# Patient Record
Sex: Male | Born: 1975 | Hispanic: Yes | Marital: Married | Smoking: Never smoker
Health system: Southern US, Community
[De-identification: ages and names within clinical notes are randomized; demographics above are authoritative.]

---

## 2017-06-24 DIAGNOSIS — Z0000 Encounter for general adult medical examination without abnormal findings: Secondary | ICD-10-CM | POA: Diagnosis not present

## 2017-06-24 DIAGNOSIS — Z125 Encounter for screening for malignant neoplasm of prostate: Secondary | ICD-10-CM | POA: Diagnosis not present

## 2017-07-01 DIAGNOSIS — E781 Pure hyperglyceridemia: Secondary | ICD-10-CM | POA: Diagnosis not present

## 2017-07-01 DIAGNOSIS — H6122 Impacted cerumen, left ear: Secondary | ICD-10-CM | POA: Diagnosis not present

## 2017-07-01 DIAGNOSIS — Z1389 Encounter for screening for other disorder: Secondary | ICD-10-CM | POA: Diagnosis not present

## 2017-07-01 DIAGNOSIS — Z0000 Encounter for general adult medical examination without abnormal findings: Secondary | ICD-10-CM | POA: Diagnosis not present

## 2017-09-18 DIAGNOSIS — R140 Abdominal distension (gaseous): Secondary | ICD-10-CM | POA: Diagnosis not present

## 2017-09-18 DIAGNOSIS — R6881 Early satiety: Secondary | ICD-10-CM | POA: Diagnosis not present

## 2017-09-18 DIAGNOSIS — R109 Unspecified abdominal pain: Secondary | ICD-10-CM | POA: Diagnosis not present

## 2018-06-25 DIAGNOSIS — Z0000 Encounter for general adult medical examination without abnormal findings: Secondary | ICD-10-CM | POA: Diagnosis not present

## 2018-07-02 DIAGNOSIS — Z0000 Encounter for general adult medical examination without abnormal findings: Secondary | ICD-10-CM | POA: Diagnosis not present

## 2018-07-02 DIAGNOSIS — E781 Pure hyperglyceridemia: Secondary | ICD-10-CM | POA: Diagnosis not present

## 2018-07-02 DIAGNOSIS — Z6826 Body mass index (BMI) 26.0-26.9, adult: Secondary | ICD-10-CM | POA: Diagnosis not present

## 2018-07-02 DIAGNOSIS — Z1389 Encounter for screening for other disorder: Secondary | ICD-10-CM | POA: Diagnosis not present

## 2019-06-29 DIAGNOSIS — Z125 Encounter for screening for malignant neoplasm of prostate: Secondary | ICD-10-CM | POA: Diagnosis not present

## 2019-06-29 DIAGNOSIS — Z0000 Encounter for general adult medical examination without abnormal findings: Secondary | ICD-10-CM | POA: Diagnosis not present

## 2019-06-29 DIAGNOSIS — E781 Pure hyperglyceridemia: Secondary | ICD-10-CM | POA: Diagnosis not present

## 2019-07-05 DIAGNOSIS — E781 Pure hyperglyceridemia: Secondary | ICD-10-CM | POA: Diagnosis not present

## 2019-07-05 DIAGNOSIS — Z1331 Encounter for screening for depression: Secondary | ICD-10-CM | POA: Diagnosis not present

## 2019-07-05 DIAGNOSIS — R0789 Other chest pain: Secondary | ICD-10-CM | POA: Diagnosis not present

## 2019-07-05 DIAGNOSIS — Z0000 Encounter for general adult medical examination without abnormal findings: Secondary | ICD-10-CM | POA: Diagnosis not present

## 2020-02-16 DIAGNOSIS — K219 Gastro-esophageal reflux disease without esophagitis: Secondary | ICD-10-CM | POA: Diagnosis not present

## 2020-02-16 DIAGNOSIS — R209 Unspecified disturbances of skin sensation: Secondary | ICD-10-CM | POA: Diagnosis not present

## 2020-02-16 DIAGNOSIS — Z1331 Encounter for screening for depression: Secondary | ICD-10-CM | POA: Diagnosis not present

## 2020-05-12 DIAGNOSIS — Z20822 Contact with and (suspected) exposure to covid-19: Secondary | ICD-10-CM | POA: Diagnosis not present

## 2020-05-12 DIAGNOSIS — Z20828 Contact with and (suspected) exposure to other viral communicable diseases: Secondary | ICD-10-CM | POA: Diagnosis not present

## 2020-06-08 DIAGNOSIS — D485 Neoplasm of uncertain behavior of skin: Secondary | ICD-10-CM | POA: Diagnosis not present

## 2020-06-08 DIAGNOSIS — D225 Melanocytic nevi of trunk: Secondary | ICD-10-CM | POA: Diagnosis not present

## 2020-06-08 DIAGNOSIS — D2261 Melanocytic nevi of right upper limb, including shoulder: Secondary | ICD-10-CM | POA: Diagnosis not present

## 2020-06-08 DIAGNOSIS — L738 Other specified follicular disorders: Secondary | ICD-10-CM | POA: Diagnosis not present

## 2020-06-08 DIAGNOSIS — D2272 Melanocytic nevi of left lower limb, including hip: Secondary | ICD-10-CM | POA: Diagnosis not present

## 2020-06-08 DIAGNOSIS — D2262 Melanocytic nevi of left upper limb, including shoulder: Secondary | ICD-10-CM | POA: Diagnosis not present

## 2020-09-13 DIAGNOSIS — Z0000 Encounter for general adult medical examination without abnormal findings: Secondary | ICD-10-CM | POA: Diagnosis not present

## 2020-09-13 DIAGNOSIS — Z125 Encounter for screening for malignant neoplasm of prostate: Secondary | ICD-10-CM | POA: Diagnosis not present

## 2020-09-13 DIAGNOSIS — E781 Pure hyperglyceridemia: Secondary | ICD-10-CM | POA: Diagnosis not present

## 2020-09-20 DIAGNOSIS — E781 Pure hyperglyceridemia: Secondary | ICD-10-CM | POA: Diagnosis not present

## 2020-09-20 DIAGNOSIS — Z0000 Encounter for general adult medical examination without abnormal findings: Secondary | ICD-10-CM | POA: Diagnosis not present

## 2020-11-16 DIAGNOSIS — Z20822 Contact with and (suspected) exposure to covid-19: Secondary | ICD-10-CM | POA: Diagnosis not present

## 2020-11-30 DIAGNOSIS — Z20822 Contact with and (suspected) exposure to covid-19: Secondary | ICD-10-CM | POA: Diagnosis not present

## 2020-11-30 DIAGNOSIS — Z1212 Encounter for screening for malignant neoplasm of rectum: Secondary | ICD-10-CM | POA: Diagnosis not present

## 2020-12-23 DIAGNOSIS — Z20822 Contact with and (suspected) exposure to covid-19: Secondary | ICD-10-CM | POA: Diagnosis not present

## 2020-12-31 DIAGNOSIS — Z20822 Contact with and (suspected) exposure to covid-19: Secondary | ICD-10-CM | POA: Diagnosis not present

## 2021-04-05 DIAGNOSIS — Z20822 Contact with and (suspected) exposure to covid-19: Secondary | ICD-10-CM | POA: Diagnosis not present

## 2021-04-05 DIAGNOSIS — Z03818 Encounter for observation for suspected exposure to other biological agents ruled out: Secondary | ICD-10-CM | POA: Diagnosis not present

## 2021-06-19 DIAGNOSIS — L814 Other melanin hyperpigmentation: Secondary | ICD-10-CM | POA: Diagnosis not present

## 2021-06-19 DIAGNOSIS — L738 Other specified follicular disorders: Secondary | ICD-10-CM | POA: Diagnosis not present

## 2021-06-19 DIAGNOSIS — D225 Melanocytic nevi of trunk: Secondary | ICD-10-CM | POA: Diagnosis not present

## 2021-09-21 DIAGNOSIS — E781 Pure hyperglyceridemia: Secondary | ICD-10-CM | POA: Diagnosis not present

## 2021-09-21 DIAGNOSIS — Z125 Encounter for screening for malignant neoplasm of prostate: Secondary | ICD-10-CM | POA: Diagnosis not present

## 2021-10-02 ENCOUNTER — Other Ambulatory Visit: Payer: Self-pay | Admitting: Internal Medicine

## 2021-10-02 DIAGNOSIS — Z0000 Encounter for general adult medical examination without abnormal findings: Secondary | ICD-10-CM | POA: Diagnosis not present

## 2021-10-02 DIAGNOSIS — E781 Pure hyperglyceridemia: Secondary | ICD-10-CM | POA: Diagnosis not present

## 2021-10-02 DIAGNOSIS — Z1212 Encounter for screening for malignant neoplasm of rectum: Secondary | ICD-10-CM | POA: Diagnosis not present

## 2021-10-02 DIAGNOSIS — Z1331 Encounter for screening for depression: Secondary | ICD-10-CM | POA: Diagnosis not present

## 2021-10-02 DIAGNOSIS — Z1339 Encounter for screening examination for other mental health and behavioral disorders: Secondary | ICD-10-CM | POA: Diagnosis not present

## 2021-10-02 DIAGNOSIS — R82998 Other abnormal findings in urine: Secondary | ICD-10-CM | POA: Diagnosis not present

## 2021-10-29 ENCOUNTER — Ambulatory Visit
Admission: RE | Admit: 2021-10-29 | Discharge: 2021-10-29 | Disposition: A | Discharge status: Home / Self Care | Payer: No Typology Code available for payment source | Source: Ambulatory Visit | Attending: Internal Medicine | Admitting: Internal Medicine

## 2021-10-29 DIAGNOSIS — E781 Pure hyperglyceridemia: Secondary | ICD-10-CM

## 2021-12-22 ENCOUNTER — Ambulatory Visit (HOSPITAL_COMMUNITY)
Admission: EM | Admit: 2021-12-22 | Discharge: 2021-12-22 | Disposition: A | Discharge status: Home / Self Care | Payer: BC Managed Care – PPO | Attending: Physician Assistant | Admitting: Physician Assistant

## 2021-12-22 ENCOUNTER — Encounter (HOSPITAL_COMMUNITY): Payer: Self-pay | Admitting: *Deleted

## 2021-12-22 ENCOUNTER — Other Ambulatory Visit: Payer: Self-pay

## 2021-12-22 DIAGNOSIS — J209 Acute bronchitis, unspecified: Secondary | ICD-10-CM | POA: Diagnosis not present

## 2021-12-22 MED ORDER — PREDNISONE 20 MG PO TABS: 40.0000 mg | ORAL_TABLET | Freq: Every day | ORAL | 0 refills | Status: AC

## 2021-12-22 NOTE — ED Triage Notes (Signed)
Pt reports he has had a cough for 20 days . He also has muscle pain from coughing.

## 2021-12-22 NOTE — ED Provider Notes (Signed)
[place]    CSN: [id number] Arrival date & time: 12/22/21  1522      History   Chief Complaint Chief Complaint  Patient presents with   Cough   Muscle Pain    HPI Juan Hawkins is a 46 y.o. male.   Patient here today for evaluation of cough that he has had the last 3 weeks.  He states that symptoms started when he traveled to [place].  He states that there was 1 morning he woke up with significant sore throat, he took medication for same and this resolved but he states he has had a cough since that time.  Cough was originally productive but has not been productive for the last couple weeks.  He denies any shortness of breath.  He does have some pain in his chest with deep breathing and cough. Cough does seem to be worse at night.  He has not had any fever.  He denies any other symptoms.  The history is provided by the patient.   History reviewed. No pertinent past medical history.  There are no problems to display for this patient.   History reviewed. No pertinent surgical history.     Home Medications    Prior to Admission medications   Medication Sig Start Date End Date Taking? Authorizing Provider  predniSONE (DELTASONE) 20 MG tablet Take 2 tablets (40 mg total) by mouth daily with breakfast for 5 days. 12/22/21 12/27/21 Yes Francene Finders, PA-C    Family History History reviewed. No pertinent family history.  Social History Social History   Tobacco Use   Smoking status: Never   Smokeless tobacco: Never     Allergies   Patient has no known allergies.   Review of Systems Review of Systems  Constitutional:  Negative for chills and fever.  HENT:  Negative for congestion, ear pain and sore throat.   Eyes:  Negative for discharge and redness.  Respiratory:  Positive for cough. Negative for shortness of breath.   Gastrointestinal:  Negative for abdominal pain, nausea and vomiting.    Physical Exam Triage Vital Signs ED Triage Vitals   Enc Vitals Group     BP      Pulse      Resp      Temp      Temp src      SpO2      Weight      Height      Head Circumference      Peak Flow      Pain Score      Pain Loc      Pain Edu?      Excl. in [place]?    No data found.  Updated Vital Signs BP 113/70    Pulse 68    Temp 98.3 F (36.8 C)    Resp 18    SpO2 100%      Physical Exam Vitals and nursing note reviewed.  Constitutional:      General: He is not in acute distress.    Appearance: Normal appearance. He is not ill-appearing.  HENT:     Head: Normocephalic and atraumatic.     Nose: Nose normal. No congestion.  Eyes:     Conjunctiva/sclera: Conjunctivae normal.  Cardiovascular:     Rate and Rhythm: Normal rate and regular rhythm.     Heart sounds: Normal heart sounds. No murmur heard. Pulmonary:     Effort: Pulmonary effort is normal. No respiratory distress.  Breath sounds: Normal breath sounds. No wheezing, rhonchi or rales.  Skin:    General: Skin is warm and dry.  Neurological:     Mental Status: He is alert.  Psychiatric:        Mood and Affect: Mood normal.        Thought Content: Thought content normal.     UC Treatments / Results  Labs (all labs ordered are listed, but only abnormal results are displayed) Labs Reviewed - No data to display  EKG   Radiology No results found.  Procedures Procedures (including critical care time)  Medications Ordered in UC Medications - No data to display  Initial Impression / Assessment and Plan / UC Course  I have reviewed the triage vital signs and the nursing notes.  Pertinent labs & imaging results that were available during my care of the patient were reviewed by me and considered in my medical decision making (see chart for details).    Suspect likely bronchitis and will treat with steroid burst.  Recommended follow-up if symptoms fail to improve or worsen.  Final Clinical Impressions(s) / UC Diagnoses   Final diagnoses:  Acute  bronchitis, unspecified organism   Discharge Instructions   None    ED Prescriptions     Medication Sig Dispense Auth. Provider   predniSONE (DELTASONE) 20 MG tablet Take 2 tablets (40 mg total) by mouth daily with breakfast for 5 days. 10 tablet Francene Finders, PA-C      PDMP not reviewed this encounter.   Francene Finders, PA-C 12/22/21 1658

## 2021-12-31 DIAGNOSIS — R071 Chest pain on breathing: Secondary | ICD-10-CM | POA: Diagnosis not present

## 2021-12-31 DIAGNOSIS — R0981 Nasal congestion: Secondary | ICD-10-CM | POA: Diagnosis not present

## 2021-12-31 DIAGNOSIS — R051 Acute cough: Secondary | ICD-10-CM | POA: Diagnosis not present

## 2022-01-04 ENCOUNTER — Other Ambulatory Visit: Payer: Self-pay | Admitting: Internal Medicine

## 2022-01-04 ENCOUNTER — Ambulatory Visit
Admission: RE | Admit: 2022-01-04 | Discharge: 2022-01-04 | Disposition: A | Discharge status: Home / Self Care | Payer: BC Managed Care – PPO | Source: Ambulatory Visit | Attending: Internal Medicine | Admitting: Internal Medicine

## 2022-01-04 DIAGNOSIS — R0602 Shortness of breath: Secondary | ICD-10-CM

## 2022-01-04 DIAGNOSIS — R071 Chest pain on breathing: Secondary | ICD-10-CM

## 2022-01-04 DIAGNOSIS — R918 Other nonspecific abnormal finding of lung field: Secondary | ICD-10-CM | POA: Diagnosis not present

## 2022-01-04 DIAGNOSIS — J219 Acute bronchiolitis, unspecified: Secondary | ICD-10-CM | POA: Diagnosis not present

## 2022-01-04 MED ORDER — IOPAMIDOL (ISOVUE-370) INJECTION 76%
80.0000 mL | Freq: Once | INTRAVENOUS | Status: AC | PRN
  Administered 2022-01-04: 80 mL via INTRAVENOUS

## 2022-04-09 DIAGNOSIS — R0602 Shortness of breath: Secondary | ICD-10-CM | POA: Diagnosis not present

## 2022-04-09 DIAGNOSIS — R42 Dizziness and giddiness: Secondary | ICD-10-CM | POA: Diagnosis not present

## 2022-04-09 DIAGNOSIS — R0689 Other abnormalities of breathing: Secondary | ICD-10-CM | POA: Diagnosis not present

## 2022-04-09 DIAGNOSIS — E781 Pure hyperglyceridemia: Secondary | ICD-10-CM | POA: Diagnosis not present

## 2022-06-17 DIAGNOSIS — L300 Nummular dermatitis: Secondary | ICD-10-CM | POA: Diagnosis not present

## 2022-06-17 DIAGNOSIS — D2271 Melanocytic nevi of right lower limb, including hip: Secondary | ICD-10-CM | POA: Diagnosis not present

## 2022-06-17 DIAGNOSIS — D2272 Melanocytic nevi of left lower limb, including hip: Secondary | ICD-10-CM | POA: Diagnosis not present

## 2022-08-23 IMAGING — CT CT CARDIAC CORONARY ARTERY CALCIUM SCORE
2 of 3 series · 10 of 20 positions shown, 12 images · non-contrast
Comparison: None.

CLINICAL DATA: 45-year-old Hispanic male with history
hyperlipidemia and family history of heart disease.

EXAM:
CT CARDIAC CORONARY ARTERY CALCIUM SCORE
TECHNIQUE: Non-contrast imaging through the heart was performed using
prospective ECG gating. Image post processing was performed on an
independent workstation, allowing for quantitative analysis of the
heart and coronary arteries. Note that this exam targets the heart
and the chest was not imaged in its entirety.

[Series 3: calcium scoring 2.00 br40 bestdiast 71% axial · axial · 0.52mm/px · z∈[+1500,+1604]mm · 5 of 80 slices shown, 7 images]
[im 14/80  vessel]
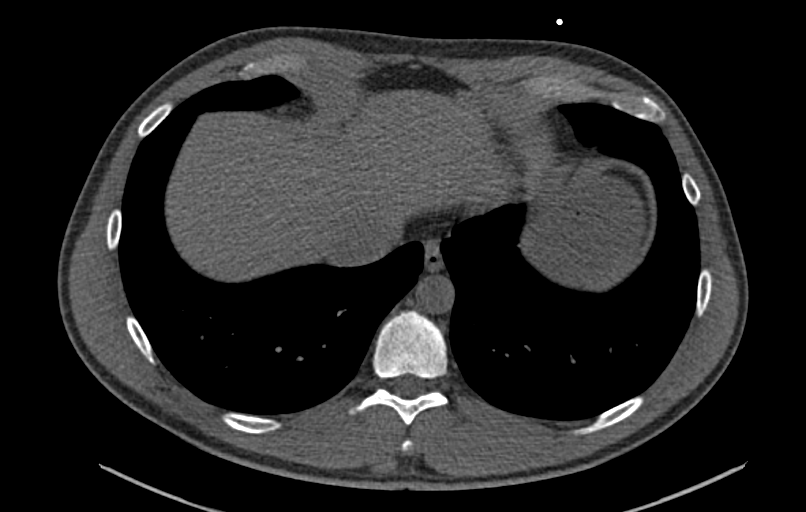
[im 14/80  lung]
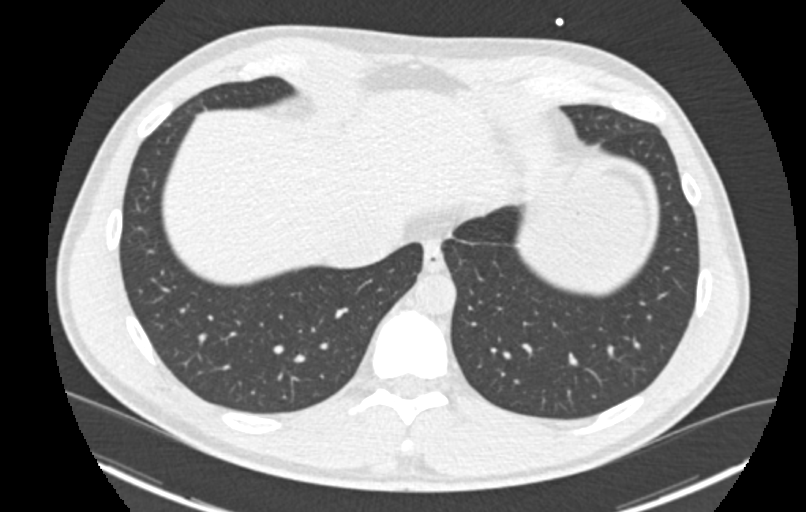
[im 27/80  vessel]
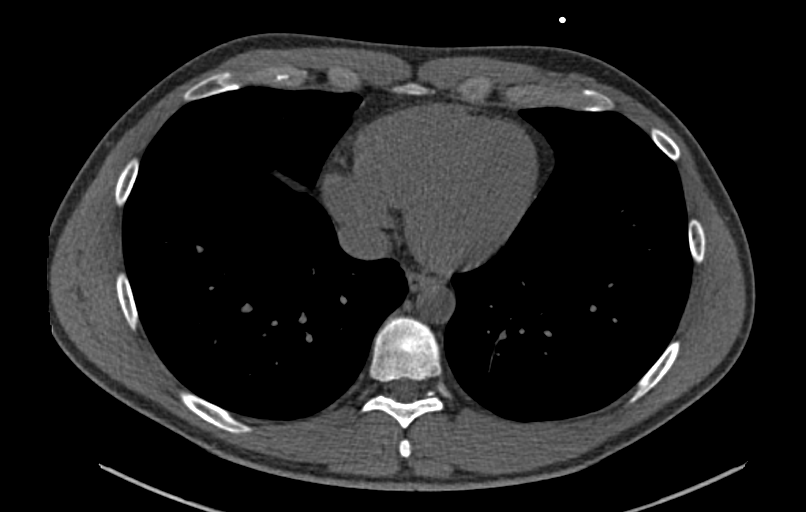
[im 40/80  vessel]
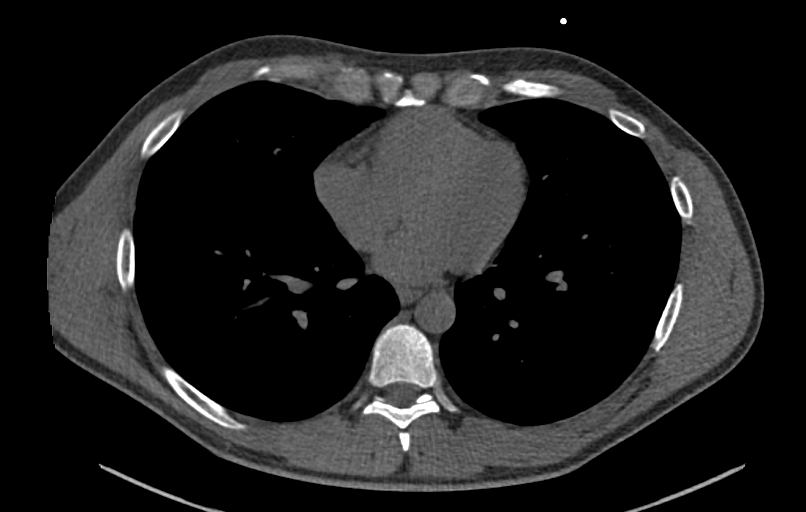
[im 53/80  vessel]
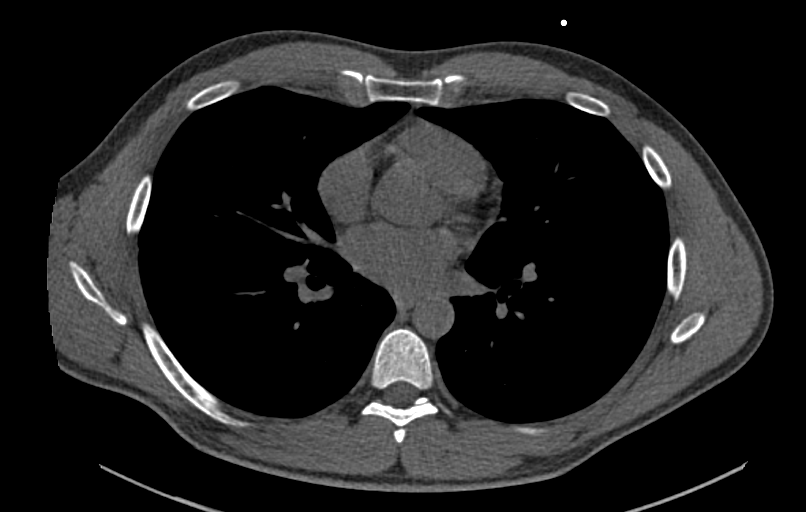
[im 66/80  vessel]
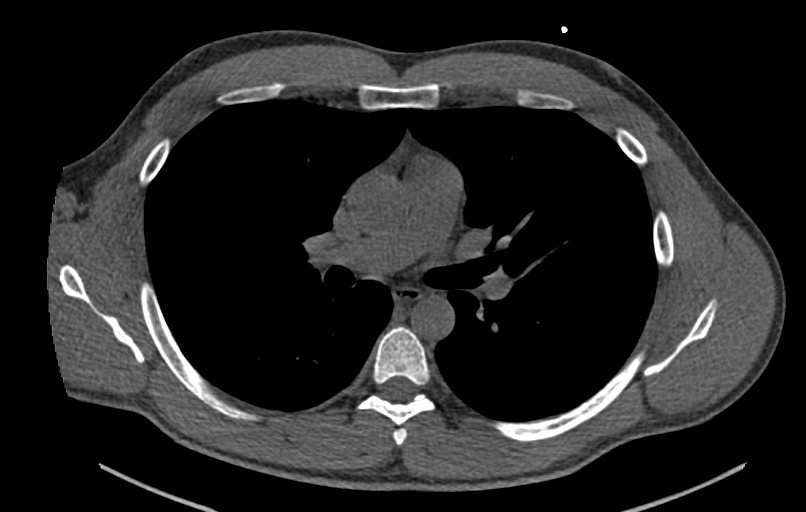
[im 66/80  lung]
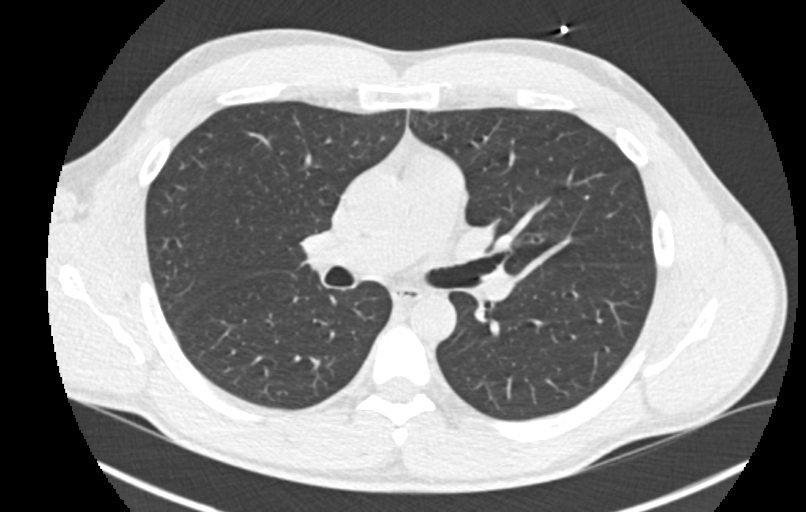

[Series 9: calcium scoring 2.00 br60 bestdiast 71% lungs · axial · 0.50mm/px · z∈[+1502,+1606]mm · 5 of 79 slices shown]
[im 14/79  vessel]
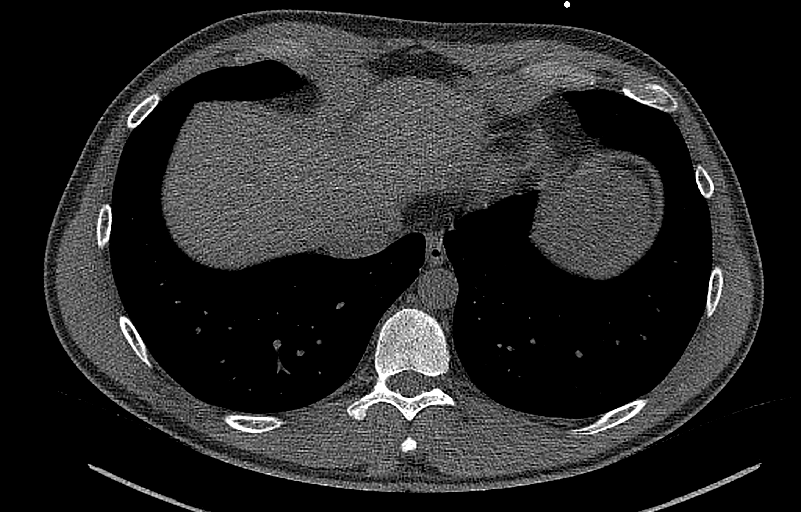
[im 27/79  vessel]
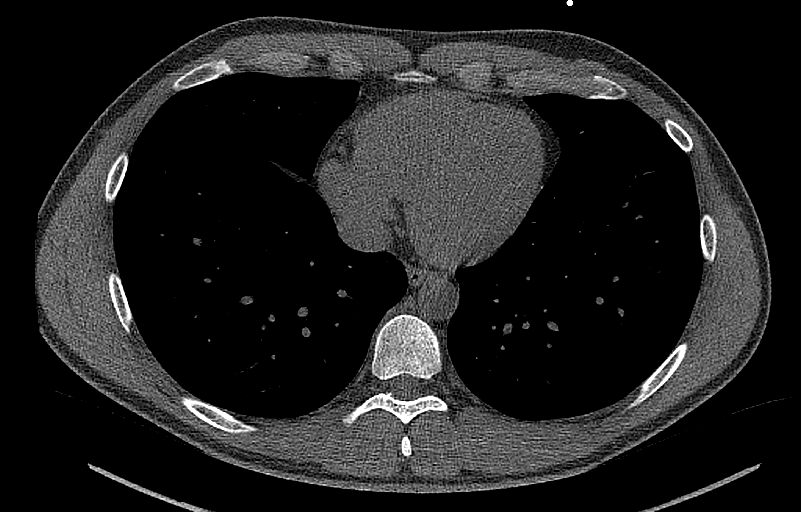
[im 40/79  vessel]
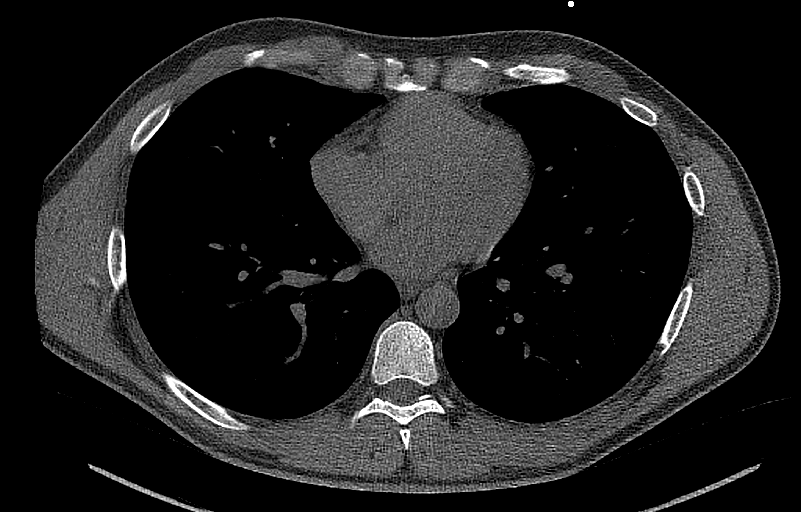
[im 53/79  vessel]
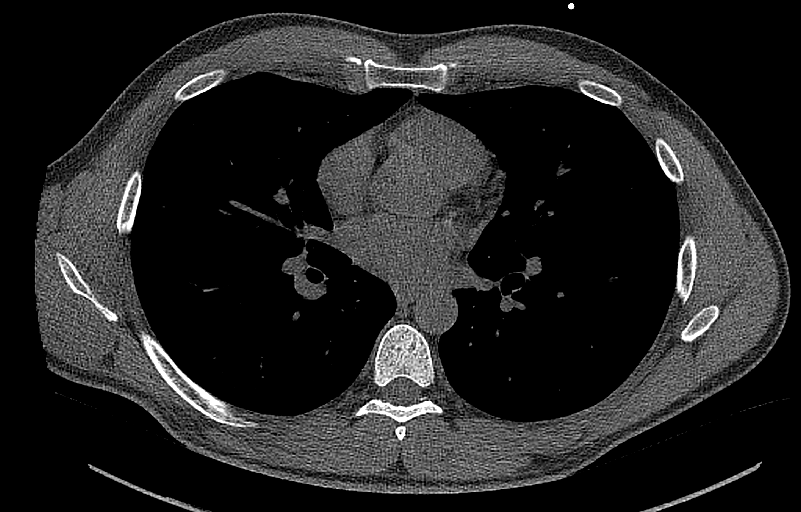
[im 66/79  vessel]
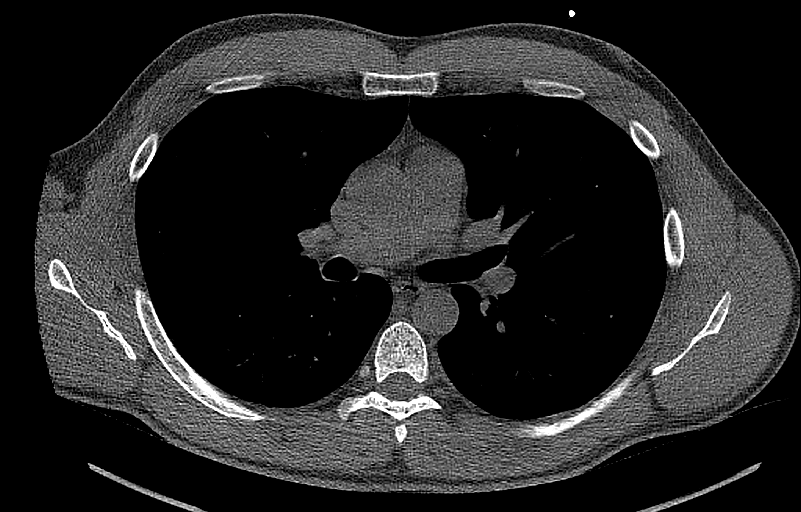

[10 of 20 positions shown; findings below may reference images not displayed]

FINDINGS: CORONARY CALCIUM SCORES:

Left Main: 0

LAD: 0

LCx: 0

RCA: 0

Total Agatston Score: 0

[HOSPITAL] percentile: 0

AORTA MEASUREMENTS:

Ascending Aorta: 30 mm

Descending Aorta: 22 mm

OTHER FINDINGS:

The heart size is within normal limits. No pericardial fluid is
identified. Visualized segments of the thoracic aorta and central
pulmonary arteries are normal in caliber. Visualized mediastinum and
hilar regions demonstrate no lymphadenopathy or masses. Small
calcified granuloma in the subpleural right middle lobe. Visualized
lungs show no evidence of pulmonary edema, consolidation,
pneumothorax or pleural fluid. Visualized upper abdomen and bony
structures are unremarkable.
IMPRESSION: Coronary calcium score of 0.

## 2022-10-09 DIAGNOSIS — Z125 Encounter for screening for malignant neoplasm of prostate: Secondary | ICD-10-CM | POA: Diagnosis not present

## 2022-10-09 DIAGNOSIS — E781 Pure hyperglyceridemia: Secondary | ICD-10-CM | POA: Diagnosis not present

## 2022-10-15 DIAGNOSIS — Z1331 Encounter for screening for depression: Secondary | ICD-10-CM | POA: Diagnosis not present

## 2022-10-15 DIAGNOSIS — Z1339 Encounter for screening examination for other mental health and behavioral disorders: Secondary | ICD-10-CM | POA: Diagnosis not present

## 2022-10-15 DIAGNOSIS — E781 Pure hyperglyceridemia: Secondary | ICD-10-CM | POA: Diagnosis not present

## 2022-10-15 DIAGNOSIS — Z0000 Encounter for general adult medical examination without abnormal findings: Secondary | ICD-10-CM | POA: Diagnosis not present

## 2022-10-15 DIAGNOSIS — R82998 Other abnormal findings in urine: Secondary | ICD-10-CM | POA: Diagnosis not present

## 2022-10-29 IMAGING — CT CT ANGIO CHEST
3 of 8 series · 17 of 36 positions shown · IV contrast (agent unspecified)
Comparison: None.

CLINICAL DATA: Shortness of breath, right-sided inspiratory chest
pain, cough for 1 month

EXAM:
CT ANGIOGRAPHY CHEST WITH CONTRAST
TECHNIQUE: Multidetector CT imaging of the chest was performed using the
standard protocol during bolus administration of intravenous
contrast. Multiplanar CT image reconstructions and MIPs were
obtained to evaluate the vascular anatomy.

[Series 11: cta pulmonary 2.00 bv36 s3 · coronal · 0.66mm/px · 1 of 145 slices shown]
[im 73/145  mediastinal]
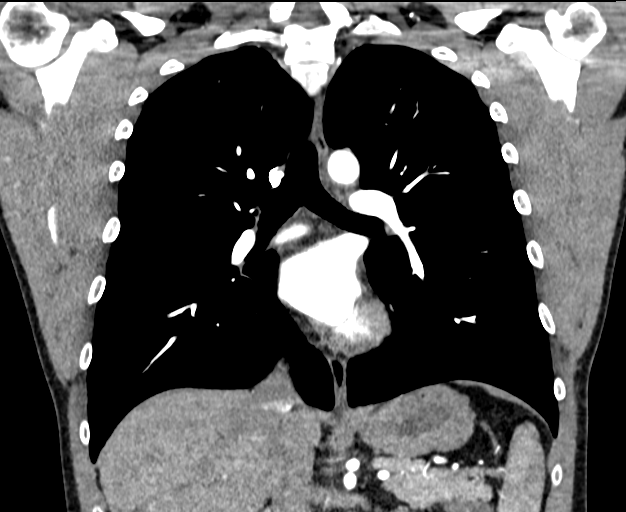

[Series 15: cta pulmonary 1.00 bv36 s3 thins · axial · 0.79mm/px · z∈[+1502,+1778]mm · 7 of 369 slices shown]
[im 47/369  lung]
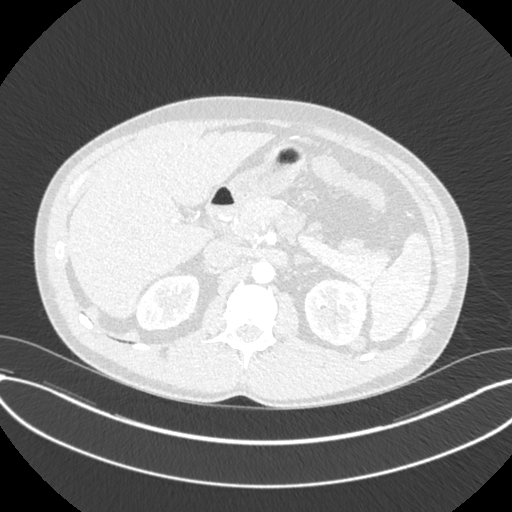
[im 93/369  lung]
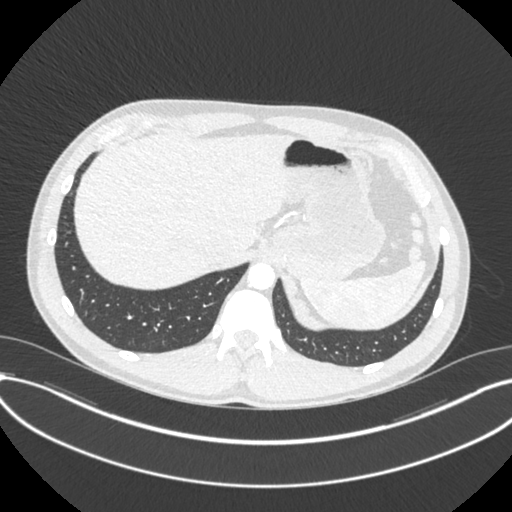
[im 139/369  lung]
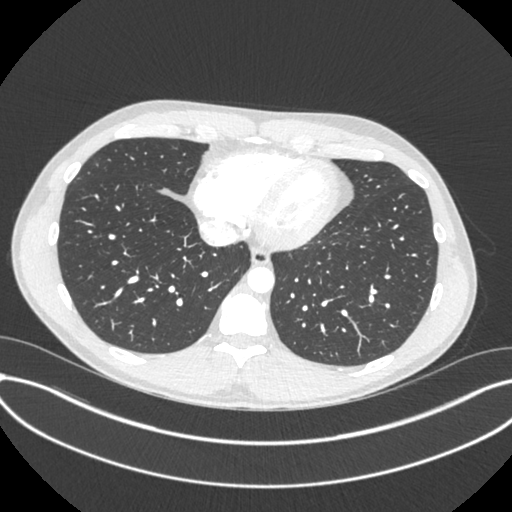
[im 185/369  lung]
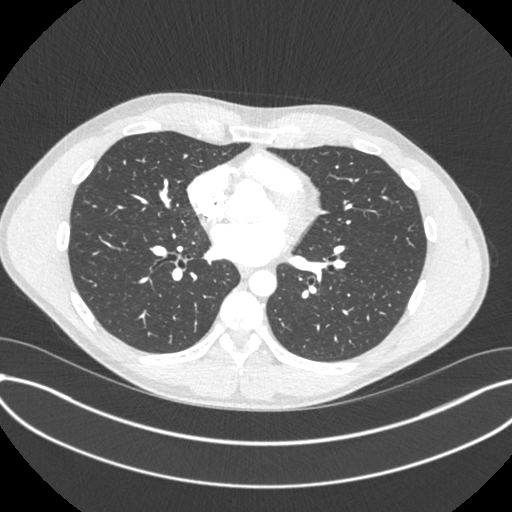
[im 231/369  lung]
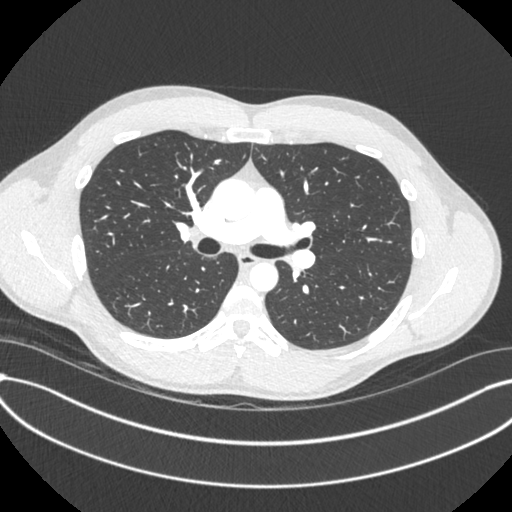
[im 277/369  lung]
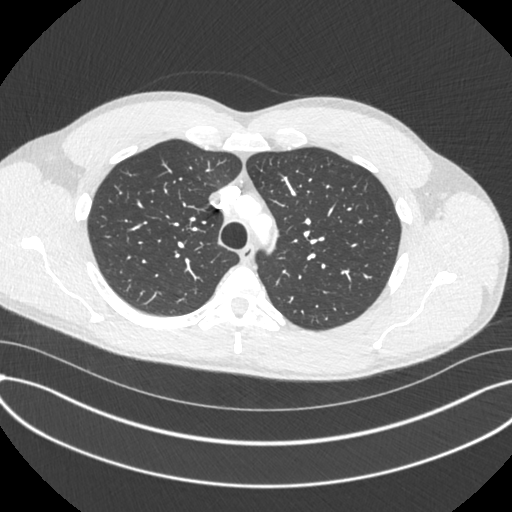
[im 323/369  lung]
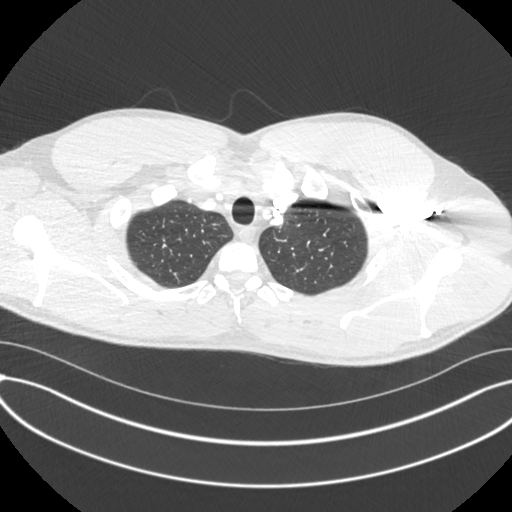

[Series 16: cta pulmonary 1.00 bv36 s3 super d. · axial · 0.83mm/px · z∈[+1497,+1790]mm · 9 of 459 slices shown]
[im 46/459  lung]
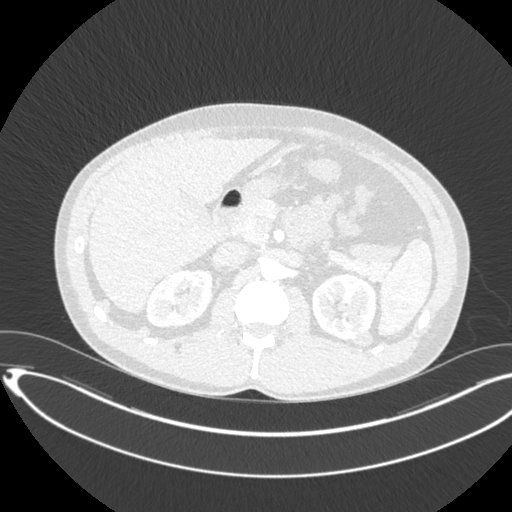
[im 92/459  mediastinal]
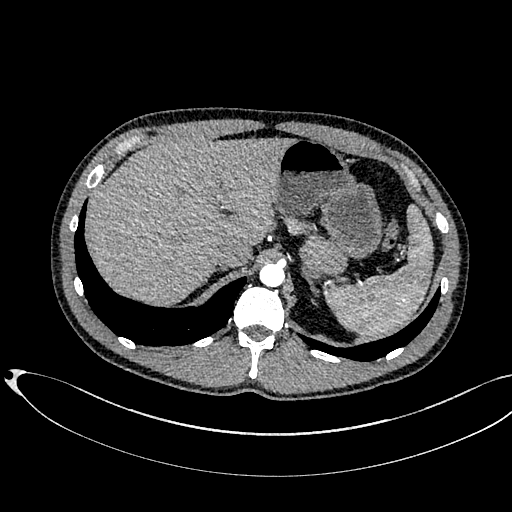
[im 138/459  lung]
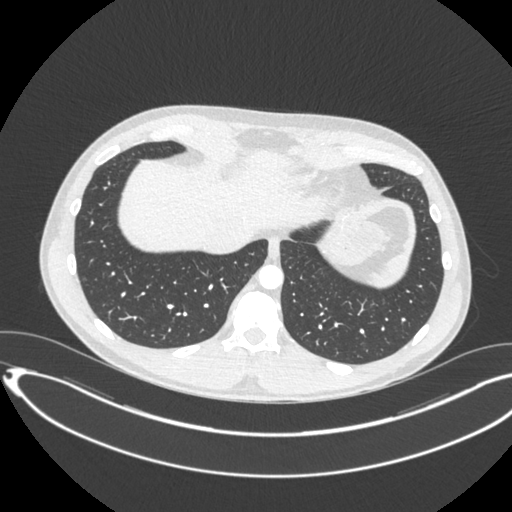
[im 184/459  mediastinal]
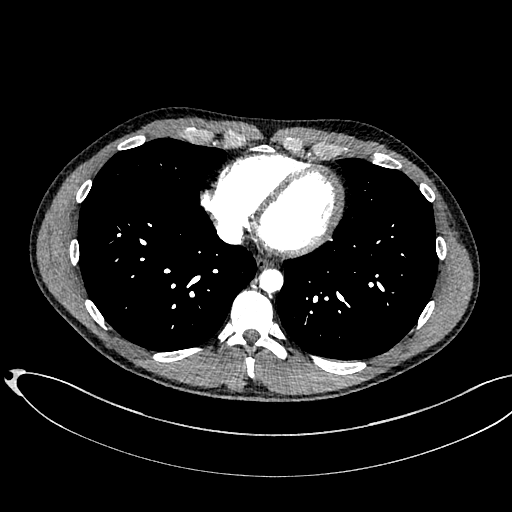
[im 230/459  lung]
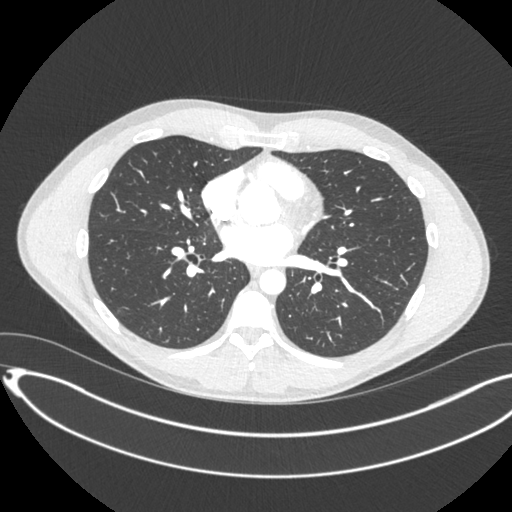
[im 275/459  mediastinal]
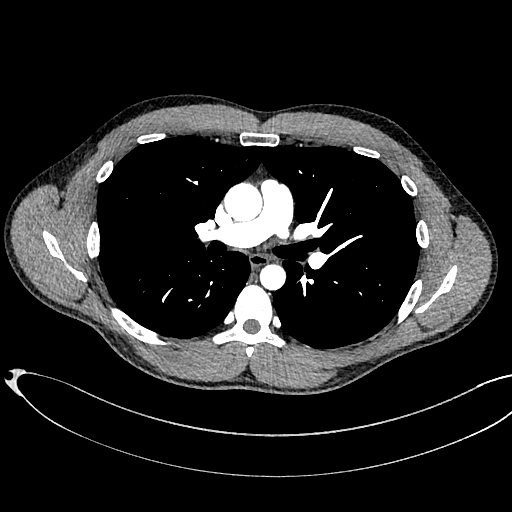
[im 321/459  lung]
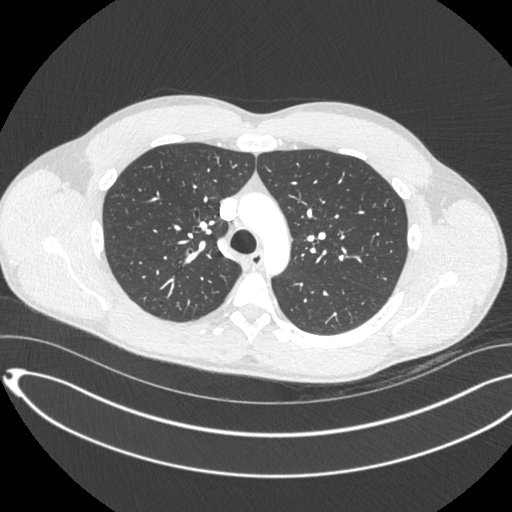
[im 367/459  mediastinal]
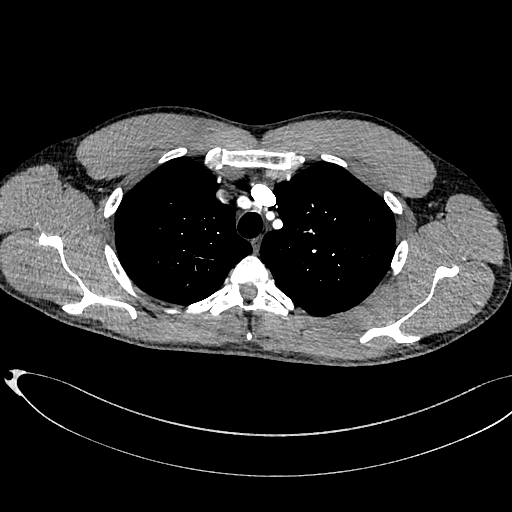
[im 413/459  lung]
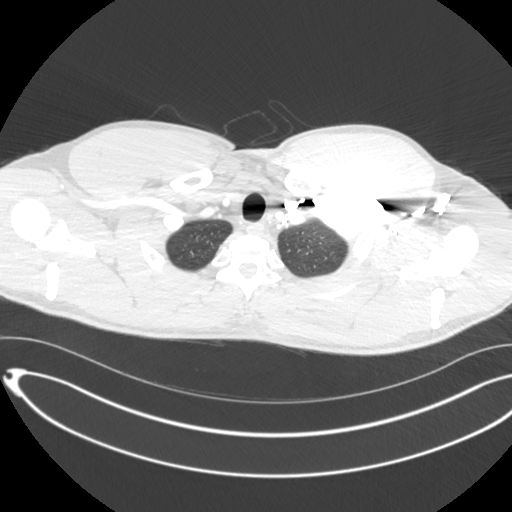

[17 of 36 positions shown; findings below may reference images not displayed]

RADIATION DOSE REDUCTION: This exam was performed according to the
departmental dose-optimization program which includes automated
exposure control, adjustment of the mA and/or kV according to
patient size and/or use of iterative reconstruction technique.

CONTRAST:  80mL IDPFE2-4XI IOPAMIDOL (IDPFE2-4XI) INJECTION 76%
FINDINGS: Cardiovascular: Satisfactory opacification of the pulmonary arteries
to the segmental level. No evidence of pulmonary embolism. Normal
heart size. No pericardial effusion.

Mediastinum/Nodes: No enlarged mediastinal, hilar, or axillary lymph
nodes. Thyroid gland, trachea, and esophagus demonstrate no
significant findings.

Lungs/Pleura: Background of very fine pulmonary nodules, most
concentrated in the lung apices. No pleural effusion or
pneumothorax.

Upper Abdomen: No acute abnormality.

Musculoskeletal: No chest wall abnormality. No acute osseous
findings.

Review of the MIP images confirms the above findings.
IMPRESSION: 1. Negative examination for pulmonary embolism.
2. Background of very fine pulmonary nodules, most concentrated in
the lung apices, most commonly seen in smoking-related respiratory
bronchiolitis.

## 2023-05-22 DIAGNOSIS — J40 Bronchitis, not specified as acute or chronic: Secondary | ICD-10-CM | POA: Diagnosis not present

## 2023-06-24 DIAGNOSIS — R059 Cough, unspecified: Secondary | ICD-10-CM | POA: Diagnosis not present

## 2023-06-24 DIAGNOSIS — J392 Other diseases of pharynx: Secondary | ICD-10-CM | POA: Diagnosis not present

## 2023-10-13 DIAGNOSIS — Z0189 Encounter for other specified special examinations: Secondary | ICD-10-CM | POA: Diagnosis not present

## 2023-10-13 DIAGNOSIS — Z0001 Encounter for general adult medical examination with abnormal findings: Secondary | ICD-10-CM | POA: Diagnosis not present

## 2023-10-13 DIAGNOSIS — E781 Pure hyperglyceridemia: Secondary | ICD-10-CM | POA: Diagnosis not present

## 2023-10-19 DIAGNOSIS — Z0001 Encounter for general adult medical examination with abnormal findings: Secondary | ICD-10-CM | POA: Diagnosis not present

## 2023-10-19 DIAGNOSIS — E781 Pure hyperglyceridemia: Secondary | ICD-10-CM | POA: Diagnosis not present

## 2023-10-19 DIAGNOSIS — Z0189 Encounter for other specified special examinations: Secondary | ICD-10-CM | POA: Diagnosis not present

## 2023-10-20 DIAGNOSIS — Z0000 Encounter for general adult medical examination without abnormal findings: Secondary | ICD-10-CM | POA: Diagnosis not present

## 2023-10-20 DIAGNOSIS — E781 Pure hyperglyceridemia: Secondary | ICD-10-CM | POA: Diagnosis not present

## 2023-10-20 DIAGNOSIS — Z1331 Encounter for screening for depression: Secondary | ICD-10-CM | POA: Diagnosis not present

## 2023-10-20 DIAGNOSIS — Z1339 Encounter for screening examination for other mental health and behavioral disorders: Secondary | ICD-10-CM | POA: Diagnosis not present

## 2023-10-20 DIAGNOSIS — R82998 Other abnormal findings in urine: Secondary | ICD-10-CM | POA: Diagnosis not present

## 2024-01-27 DIAGNOSIS — Z1211 Encounter for screening for malignant neoplasm of colon: Secondary | ICD-10-CM | POA: Diagnosis not present

## 2024-03-08 DIAGNOSIS — K648 Other hemorrhoids: Secondary | ICD-10-CM | POA: Diagnosis not present

## 2024-03-08 DIAGNOSIS — Z1211 Encounter for screening for malignant neoplasm of colon: Secondary | ICD-10-CM | POA: Diagnosis not present

## 2024-06-14 DIAGNOSIS — D2272 Melanocytic nevi of left lower limb, including hip: Secondary | ICD-10-CM | POA: Diagnosis not present

## 2024-06-14 DIAGNOSIS — L853 Xerosis cutis: Secondary | ICD-10-CM | POA: Diagnosis not present

## 2024-06-14 DIAGNOSIS — D2261 Melanocytic nevi of right upper limb, including shoulder: Secondary | ICD-10-CM | POA: Diagnosis not present

## 2024-06-14 DIAGNOSIS — L738 Other specified follicular disorders: Secondary | ICD-10-CM | POA: Diagnosis not present
